# Patient Record
Sex: Male | Born: 1990 | Race: White | Hispanic: No | Marital: Single | Smoking: Never smoker
Health system: Southern US, Community
[De-identification: ages and names within clinical notes are randomized; demographics above are authoritative.]

## PROBLEM LIST (undated history)

## (undated) HISTORY — PX: WISDOM TOOTH EXTRACTION: SHX21

## (undated) HISTORY — PX: TONSILLECTOMY: SUR1361

---
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
## 2011-05-11 ENCOUNTER — Ambulatory Visit: Payer: Self-pay | Admitting: Family Medicine

## 2012-12-20 ENCOUNTER — Ambulatory Visit: Payer: Self-pay | Admitting: Family Medicine

## 2012-12-20 LAB — RAPID STREP-A WITH REFLX: Micro Text Report: NEGATIVE

## 2012-12-23 LAB — BETA STREP CULTURE(ARMC)

## 2013-09-07 ENCOUNTER — Ambulatory Visit: Payer: Self-pay | Admitting: Physician Assistant

## 2013-09-07 LAB — URINALYSIS, COMPLETE
BACTERIA: NEGATIVE
BLOOD: NEGATIVE
Bilirubin,UR: NEGATIVE
GLUCOSE, UR: NEGATIVE mg/dL (ref 0–75)
Ketone: NEGATIVE
LEUKOCYTE ESTERASE: NEGATIVE
Nitrite: NEGATIVE
Ph: 6 (ref 4.5–8.0)
Protein: NEGATIVE
SPECIFIC GRAVITY: 1.025 (ref 1.003–1.030)
WBC UR: NONE SEEN /HPF (ref 0–5)

## 2013-09-07 LAB — GC/CHLAMYDIA PROBE AMP

## 2013-12-08 ENCOUNTER — Ambulatory Visit: Payer: Self-pay

## 2013-12-08 LAB — CBC WITH DIFFERENTIAL/PLATELET
Basophil #: 0 x10 3/mm 3
Basophil %: 0.4 %
Eosinophil #: 0 x10 3/mm 3
Eosinophil %: 0.7 %
HCT: 44.5 %
HGB: 14.8 g/dL
Lymphocyte %: 24.4 %
Lymphs Abs: 1.1 x10 3/mm 3
MCH: 29.7 pg
MCHC: 33.2 g/dL
MCV: 90 fL
Monocyte #: 0.5 "x10 3/mm "
Monocyte %: 10.8 %
Neutrophil #: 2.9 x10 3/mm 3
Neutrophil %: 63.7 %
Platelet: 165 x10 3/mm 3
RBC: 4.97 x10 6/mm 3
RDW: 13.7 %
WBC: 4.5 x10 3/mm 3

## 2013-12-08 LAB — MONONUCLEOSIS SCREEN: Mono Test: POSITIVE

## 2013-12-08 LAB — RAPID STREP-A WITH REFLX: Micro Text Report: NEGATIVE

## 2013-12-08 LAB — RAPID INFLUENZA A&B ANTIGENS

## 2013-12-11 LAB — BETA STREP CULTURE(ARMC)

## 2014-07-14 ENCOUNTER — Encounter: Payer: Self-pay | Admitting: Family Medicine

## 2014-07-14 DIAGNOSIS — L209 Atopic dermatitis, unspecified: Secondary | ICD-10-CM | POA: Insufficient documentation

## 2014-07-14 DIAGNOSIS — M26609 Unspecified temporomandibular joint disorder, unspecified side: Secondary | ICD-10-CM

## 2014-07-14 DIAGNOSIS — B353 Tinea pedis: Secondary | ICD-10-CM | POA: Insufficient documentation

## 2014-07-14 DIAGNOSIS — M26659 Arthropathy of unspecified temporomandibular joint: Secondary | ICD-10-CM | POA: Insufficient documentation

## 2016-09-15 ENCOUNTER — Ambulatory Visit
Admission: EM | Admit: 2016-09-15 | Discharge: 2016-09-15 | Disposition: A | Discharge status: Home / Self Care | Payer: BLUE CROSS/BLUE SHIELD | Attending: Family Medicine | Admitting: Family Medicine

## 2016-09-15 ENCOUNTER — Encounter: Payer: Self-pay | Admitting: *Deleted

## 2016-09-15 DIAGNOSIS — R109 Unspecified abdominal pain: Secondary | ICD-10-CM | POA: Diagnosis not present

## 2016-09-15 DIAGNOSIS — N453 Epididymo-orchitis: Secondary | ICD-10-CM

## 2016-09-15 LAB — CHLAMYDIA/NGC RT PCR (ARMC ONLY)
CHLAMYDIA TR: NOT DETECTED
N gonorrhoeae: NOT DETECTED

## 2016-09-15 MED ORDER — CEFTRIAXONE SODIUM 1 G IJ SOLR
1.0000 g | Freq: Once | INTRAMUSCULAR | Status: AC
  Administered 2016-09-15: 1 g via INTRAMUSCULAR

## 2016-09-15 MED ORDER — LEVOFLOXACIN 500 MG PO TABS: 500.0000 mg | ORAL_TABLET | Freq: Every day | ORAL | 0 refills | Status: DC

## 2016-09-15 NOTE — ED Provider Notes (Signed)
[facility]    CSN: [id number] Arrival date & time: 09/15/16  0850     History   Chief Complaint Chief Complaint  Patient presents with  . Abdominal Pain  . Groin Pain  . Testicle Pain    HPI Tyrone Edwards is a 26 y.o. male.   Patient is 26 year old white male with right testicular pain. He states that he started having some discomfort last week. Pain was somewhat vague on the right side and then it started getting more pronounced by the weekend. He was going to see his doctor on Monday to have this pain reevaluated found that he was longer a patient at the [facility] he was [facility] for years and was taking them in his mid July. Also by Monday the pain has cleared and got better. Because the pain is gotten better he thought that was resolved but Wednesday and Thursday was a friend farm doing some clearing of the leg and not doing any heavy lifting cyanosis and pain in the right testicle again and by this morning pain is increased. He denies any discharge or discomfort. He does report having some type of pain in his right testicle 3 years ago. His own able to really exercise with this pain is similar to the pain he had 3 years ago (have ultrasound at that time which was negative. Initially he could not say what test was done that was at [facility] and when further asked whether there was a ultrasound he is able to confirm that it was an ultrasound states that he was last sexually active in March. No trauma to the testicle or to the ovary area. No previous surgeries operation he has no cyst can past medical history. He does not smoke or chew tobacco tobacco other than tonsillectomy. No previous surgery. He's got strong family history of cancer with father jaw cancer smoker cancers in the family   The history is provided by the patient. No language interpreter was used.  Abdominal Pain  Pain location: Lower abdominal and right testicular pain. Pain quality: aching, shooting,  squeezing and stabbing   Pain radiates to:  Perineum Pain severity:  Moderate Onset quality:  Sudden Duration:  10 days Timing:  Intermittent Progression:  Worsening Chronicity:  New Relieved by:  Nothing Worsened by:  Movement Ineffective treatments:  None tried Associated symptoms: sore throat   Associated symptoms: no anorexia, no belching, no chest pain, no cough, no dysuria, no fatigue, no melena, no nausea and no shortness of breath   Groin Pain  Associated symptoms include abdominal pain. Pertinent negatives include no chest pain and no shortness of breath.  Testicle Pain  Associated symptoms include abdominal pain. Pertinent negatives include no chest pain and no shortness of breath.    History reviewed. No pertinent past medical history.  Patient Active Problem List   Diagnosis Date Noted  . Athlete's foot 07/14/2014  . 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 Atopic dermatitis 07/14/2014    Past Surgical History:  Procedure Laterality Date  . TONSILLECTOMY         Home Medications    Prior to Admission medications   Medication Sig Start Date End Date Taking? Authorizing Provider  levofloxacin (LEVAQUIN) 500 MG tablet Take 1 tablet (500 mg total) by mouth daily. 09/15/16   Hassan Rowan, MD    Family History History reviewed. No pertinent family history.  Social History Social History  Substance Use Topics  . Smoking status: Never Smoker  .  Smokeless tobacco: Never Used  . Alcohol use Yes     Allergies   Patient has no known allergies.   Review of Systems Review of Systems  Constitutional: Negative for fatigue.  HENT: Positive for sore throat.   Respiratory: Negative for cough and shortness of breath.   Cardiovascular: Negative for chest pain.  Gastrointestinal: Positive for abdominal pain. Negative for anorexia, melena and nausea.  Genitourinary: Positive for testicular pain. Negative for dysuria.  All other systems reviewed and  are negative.    Physical Exam Triage Vital Signs ED Triage Vitals  Enc Vitals Group     BP 09/15/16 0946 113/81     Pulse Rate 09/15/16 0946 72     Resp 09/15/16 0946 16     Temp 09/15/16 0946 98.3 F (36.8 C)     Temp Source 09/15/16 0946 Oral     SpO2 09/15/16 0946 100 %     Weight 09/15/16 0948 130 lb (59 kg)     Height 09/15/16 0948 5\' 11"  (1.803 m)     Head Circumference --      Peak Flow --      Pain Score --      Pain Loc --      Pain Edu? --      Excl. in GC? --    No data found.   Updated Vital Signs BP 113/81 (BP Location: Left Arm)   Pulse 72   Temp 98.3 F (36.8 C) (Oral)   Resp 16   Ht 5\' 11"  (1.803 m)   Wt 130 lb (59 kg)   SpO2 100%   BMI 18.13 kg/m   Visual Acuity Right Eye Distance:   Left Eye Distance:   Bilateral Distance:    Right Eye Near:   Left Eye Near:    Bilateral Near:     Physical Exam  Constitutional: He is oriented to person, place, and time. He appears well-developed and well-nourished.  HENT:  Head: Normocephalic and atraumatic.  Eyes: Pupils are equal, round, and reactive to light.  Neck: Normal range of motion.  Pulmonary/Chest: Effort normal.  Abdominal: Hernia confirmed negative in the right inguinal area and confirmed negative in the left inguinal area.  Genitourinary: Penis normal. Right testis shows swelling and tenderness. Circumcised. No penile tenderness.     Genitourinary Comments: Skin has marked tenderness of the right testicle is no signs of left testicle pain to left inguinal pain the right testicle so tender that the pain and tenderness close up into the scrotal sac. No signs of hernia or tenderness over the initial band but when that testicle or epididymis is touch he feels pain going straight up his testicle into his lower abdomen  Musculoskeletal: Normal range of motion.  Lymphadenopathy: No inguinal adenopathy noted on the right or left side.  Neurological: He is alert and oriented to person, place, and  time.  Skin: Skin is warm.  Psychiatric: He has a normal mood and affect.  Vitals reviewed.    UC Treatments / Results  Labs (all labs ordered are listed, but only abnormal results are displayed) Labs Reviewed  CHLAMYDIA/NGC RT PCR [facility] ONLY)    EKG  EKG Interpretation None       Radiology No results found.  Procedures Procedures (including critical care time)  Medications Ordered in UC Medications  cefTRIAXone (ROCEPHIN) injection 1 g (1 g Intramuscular Given 09/15/16 1029)     Initial Impression / Assessment and Plan / UC Course  I have  reviewed the triage vital signs and the nursing notes.  Pertinent labs & imaging results that were available during my care of the patient were reviewed by me and considered in my medical decision making (see chart for details).     Patient appears to have epididymitis and orchitis. He the pain may be different from the pain he had 3 years ago because he has both infected. Last sexual activity states was in March was to get a urine GC and chlamydia but at this time not really concerned about GC and Chlamydia pending (a orchitis epididymitis probably more susceptible [place] had this before. Will going to give him a gram of Rocephin IM placed on Levaquin 500 for 10 days warned about not doing any heavy lifting if GC chlamydia is positive we'll need to add doxycycline to his regimen he is going to be working the formal Wednesday before that time he be doing better. Follow-up with PCP if he continues to have trouble  Final Clinical Impressions(s) / UC Diagnoses   Final diagnoses:  Orchitis and epididymitis    New Prescriptions Discharge Medication List as of 09/15/2016 10:23 AM    START taking these medications   Details  levofloxacin (LEVAQUIN) 500 MG tablet Take 1 tablet (500 mg total) by mouth daily., Starting Fri 09/15/2016, Normal        Note: This dictation was prepared with Dragon dictation along with smaller  phrase technology. Any transcriptional errors that result from this process are unintentional.   Hassan RowanWade, Kaveh Kissinger, MD 09/15/16 [phone]

## 2016-09-15 NOTE — ED Triage Notes (Signed)
Groin pain that radiates to the abd and right testicle. Was involved in heavy activity yesterday. One similar episode 1 week ago that resolved spontaneously. Denies other symptoms.

## 2016-09-15 NOTE — Discharge Instructions (Signed)
Patient bisected any heavy lifting or strenuous activities due to increased risk of tendon interruption on a quinolone

## 2016-10-10 ENCOUNTER — Ambulatory Visit
Admission: EM | Admit: 2016-10-10 | Discharge: 2016-10-10 | Disposition: A | Discharge status: Home / Self Care | Payer: BLUE CROSS/BLUE SHIELD | Attending: Family Medicine | Admitting: Family Medicine

## 2016-10-10 ENCOUNTER — Ambulatory Visit
Admit: 2016-10-10 | Discharge: 2016-10-10 | Disposition: A | Discharge status: Home / Self Care | Payer: BLUE CROSS/BLUE SHIELD | Attending: Family Medicine | Admitting: Family Medicine

## 2016-10-10 ENCOUNTER — Encounter: Payer: Self-pay | Admitting: Emergency Medicine

## 2016-10-10 DIAGNOSIS — N50811 Right testicular pain: Secondary | ICD-10-CM | POA: Insufficient documentation

## 2016-10-10 NOTE — ED Triage Notes (Signed)
Patient states that it has been about 2 weeks since he has finished his antibiotic.  Patient reports that his right testicle pain started back over the weekend.  Patient denies fevers.

## 2016-10-10 NOTE — Discharge Instructions (Signed)
Ultrasound Over the counter tylenol/ibuprofen

## 2016-10-24 ENCOUNTER — Ambulatory Visit
Admission: RE | Admit: 2016-10-24 | Discharge: 2016-10-24 | Disposition: A | Discharge status: Home / Self Care | Payer: BLUE CROSS/BLUE SHIELD | Source: Ambulatory Visit | Attending: Emergency Medicine | Admitting: Emergency Medicine

## 2016-10-24 ENCOUNTER — Other Ambulatory Visit: Payer: Self-pay | Admitting: Emergency Medicine

## 2016-10-24 DIAGNOSIS — K625 Hemorrhage of anus and rectum: Secondary | ICD-10-CM

## 2016-10-24 MED ORDER — IOPAMIDOL (ISOVUE-300) INJECTION 61%
100.0000 mL | Freq: Once | INTRAVENOUS | Status: AC | PRN
  Administered 2016-10-24: 100 mL via INTRAVENOUS

## 2016-10-27 ENCOUNTER — Encounter: Payer: Self-pay | Admitting: Physician Assistant

## 2016-11-09 ENCOUNTER — Encounter (INDEPENDENT_AMBULATORY_CARE_PROVIDER_SITE_OTHER): Payer: Self-pay

## 2016-11-09 ENCOUNTER — Ambulatory Visit (INDEPENDENT_AMBULATORY_CARE_PROVIDER_SITE_OTHER): Payer: BLUE CROSS/BLUE SHIELD | Admitting: Physician Assistant

## 2016-11-09 ENCOUNTER — Encounter: Payer: Self-pay | Admitting: Physician Assistant

## 2016-11-09 VITALS — BP 116/74 | HR 72 | Ht 71.0 in | Wt 131.0 lb

## 2016-11-09 DIAGNOSIS — K648 Other hemorrhoids: Secondary | ICD-10-CM

## 2016-11-09 DIAGNOSIS — K625 Hemorrhage of anus and rectum: Secondary | ICD-10-CM | POA: Diagnosis not present

## 2016-11-09 MED ORDER — HYDROCORTISONE 2.5 % RE CREA: 1.0000 "application " | TOPICAL_CREAM | Freq: Two times a day (BID) | RECTAL | 1 refills | Status: AC

## 2016-11-09 MED ORDER — HYDROCORTISONE 2.5 % RE CREA: 1.0000 "application " | TOPICAL_CREAM | Freq: Two times a day (BID) | RECTAL | 1 refills | Status: DC

## 2016-11-09 NOTE — Patient Instructions (Signed)
We have sent a prescription for Hydrocortisone cream to your pharmacy. You should apply a pea size amount to a glycerin suppository twice a day x 7 days.

## 2016-11-09 NOTE — Progress Notes (Signed)
Chief Complaint: Rectal bleeding and discomfort  HPI:  Tyrone Edwards is a 26 year old Caucasian male with no significant past medical history,  who was referred to me by Deborha Payment, PA-C for a complaint of rectal bleeding .     Patient was seen at the urgent care on 10/24/16 for complaint of rectal bleeding. He had a rectal exam at that time which showed no evidence of external hemorrhoids, though there was frank blood in his stool noted via hemoccult cards. He was prescribed Anusol suppositories. It was also thought that his tenderness was out of proportion to his exam so he was scheduled for a CT of the pelvis with contrast. This was normal with no evidence of perirectal/perianal fluid collection or abscess.   Today, the patient presents to clinic accompanied by his mother and tells me that at the beginning of June he was diagnosed with orchitis and put on antibiotics. His symptoms seemed to go away but about a month later he developed an "awareness" in the same testicle as he was having pain before. He had imaging which was positive for varicose veins.     Then about 2-3 weeks ago the patient developed some bright red blood when he would wipe on the toilet paper. This was "never a huge amount", so he was not worried about it. He then walked one and a half blocks to the grocery store and when he got home had some more rectal discomfort and more blood. He went to the urgent care as above. Patient tells me he never picked up his prescribed suppositories but has been using Preparation H cream and wipes around the outside of his rectum. He feels as though this has been helping his symptoms. His last evidence of blood was last Saturday, approximately 6 days ago when he went for a "[place], another 1.5 miles". Patient tells me he is not typically very active. He does sit a lot but tells me he thinks he has pretty normal stools which are formed. He may have 1-2 a day.     He denies any abdominal pain,  rectal pain or change in bowel habits. No family history of IBD or colorectal cancer.  Past Medical History: Orchitis  Past Surgical History:  Procedure Laterality Date  . TONSILLECTOMY    . WISDOM TOOTH EXTRACTION      Current Outpatient Prescriptions  Medication Sig Dispense Refill  . hydrocortisone (ANUSOL-HC) 2.5 % rectal cream Place 1 application rectally 2 (two) times daily. 30 g 1   No current facility-administered medications for this visit.     Allergies as of 11/09/2016  . (No Known Allergies)    Family History  Problem Relation Age of Onset  . Diabetes Mother   . Throat cancer Father   . COPD Paternal Grandmother     Social History   Social History  . Marital status: Single    Spouse name: N/A  . Number of children: 0  . Years of education: N/A   Occupational History  . student    Social History Main Topics  . Smoking status: Never Smoker  . Smokeless tobacco: Never Used  . Alcohol use Yes  . Drug use: No  . Sexual activity: Not on file   Other Topics Concern  . Not on file   Social History Narrative  . No narrative on file    Review of Systems:     Constitutional: No weight loss, fever, chills, weakness or fatigue Skin: No rash  Cardiovascular: No chest pain Respiratory: No SOB  Gastrointestinal: See HPI and otherwise negative Genitourinary: No dysuria  Neurological: No headache Musculoskeletal: No new muscle or joint pain Hematologic: No bruising Psychiatric: Positive for depression   Physical Exam:  Vital signs: BP 116/74   Pulse 72   Ht 5\' 11"  (1.803 m)   Wt 131 lb (59.4 kg)   BMI 18.27 kg/m   Constitutional:   Pleasant thin appearing Caucasian male appears to be in NAD, Well developed, Well nourished, alert and cooperative Head:  Normocephalic and atraumatic. Eyes:   PEERL, EOMI. No icterus. Conjunctiva pink. Ears:  Normal auditory acuity. Neck:  Supple Throat: Oral cavity and pharynx without inflammation, swelling or  lesion.  Respiratory: Respirations even and unlabored. Lungs clear to auscultation bilaterally.   No wheezes, crackles, or rhonchi.  Cardiovascular: Normal S1, S2. No MRG. Regular rate and rhythm. No peripheral edema, cyanosis or pallor.  Gastrointestinal:  Soft, nondistended, nontender. No rebound or guarding. Normal bowel sounds. No appreciable masses or hepatomegaly. Rectal:  External exam:no hemorrhoids, tenderness, fissure, erythema ; Anoscope: (limited due to patient's clenching) at very tip of scope visualized inflamed internal hemorrhoids Msk:  Symmetrical without gross deformities. Without edema, no deformity or joint abnormality.  Neurologic:  Alert and  oriented x4;  grossly normal neurologically.  Skin:   Dry and intact without significant lesions or rashes. Psychiatric: Demonstrates good judgement and reason without abnormal affect or behaviors.  No recent labs or imaging  Assessment: 1. Rectal bleeding: Intermittent, worse with long walks, likely related to hemorrhoids 2. Internal hemorrhoids: Partially visualized via anoscopy today, inflamed  Plan: 1. Discussed with the patient that I was unable to fully visualize his rectal vault, but could see some inflamed internal hemorrhoids: Recommend we treat these. 2. Prescribed Hydrocortisone ointment and instructed the patient to buy glycerin suppositories to be used twice a day 7 days with one refill. 3. We discussed hemorrhoids and there formation. Answered patient's questions 4. Patient was instructed to increase fiber in his diet to at least 25-35 g per day with use of fiber supplement and/or through his diet. 5. Discussed with the patient that he has increased bleeding or rectal discomfort, recommend he have a colonoscopy for further evaluation 6. Patient to follow in clinic with me or Dr. Lavon PaganiniNandigam in the future as needed.  Hyacinth MeekerJennifer Myrtha Tonkovich, PA-C [facility] 11/09/2016, 10:18 AM  Cc: Deborha PaymentMeyer, Ashley L, PA-C

## 2016-11-10 NOTE — Progress Notes (Signed)
Reviewed and agree with documentation and assessment and plan. K. Veena Kanae Ignatowski , MD   

## 2016-12-31 NOTE — ED Provider Notes (Signed)
[facility]    CSN: [id number] Arrival date & time: 10/10/16  1224     History   Chief Complaint Chief Complaint  Patient presents with  . Testicle Pain    HPI Tyrone Edwards is a 26 y.o. male.   26 yo male with a c/o right testicle pain for about 3 days. Denies any injuries, trauma, fevers, chills, dysuria, penile discharge, redness, swelling. States was treated several weeks ago with a antibiotic for similar and finished that course about 2 weeks ago.    The history is provided by the patient.  Testicle Pain     History reviewed. No pertinent past medical history.  Patient Active Problem List   Diagnosis Date Noted  . Athlete's foot 07/14/2014  . 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 Atopic dermatitis 07/14/2014    Past Surgical History:  Procedure Laterality Date  . TONSILLECTOMY    . WISDOM TOOTH EXTRACTION         Home Medications    Prior to Admission medications   Medication Sig Start Date End Date Taking? Authorizing Provider  hydrocortisone (ANUSOL-HC) 2.5 % rectal cream Place 1 application rectally 2 (two) times daily. 11/09/16   Unk Lightning, PA    Family History Family History  Problem Relation Age of Onset  . Diabetes Mother   . Throat cancer Father   . COPD Paternal Grandmother     Social History Social History  Substance Use Topics  . Smoking status: Never Smoker  . Smokeless tobacco: Never Used  . Alcohol use Yes     Allergies   Patient has no known allergies.   Review of Systems Review of Systems  Genitourinary: Positive for testicular pain.     Physical Exam Triage Vital Signs ED Triage Vitals  Enc Vitals Group     BP 10/10/16 1247 114/80     Pulse Rate 10/10/16 1247 97     Resp 10/10/16 1247 16     Temp 10/10/16 1247 98.8 F (37.1 C)     Temp Source 10/10/16 1247 Oral     SpO2 10/10/16 1247 99 %     Weight 10/10/16 1246 130 lb (59 kg)     Height 10/10/16 1246   (1.803 m)     Head Circumference --      Peak Flow --      Pain Score 10/10/16 1246 1     Pain Loc --      Pain Edu? --      Excl. in GC? --    No data found.   Updated Vital Signs BP 114/80 (BP Location: Left Arm)   Pulse 97   Temp 98.8 F (37.1 C) (Oral)   Resp 16   Ht  (1.803 m)   Wt 130 lb (59 kg)   SpO2 99%   BMI 18.13 kg/m   Visual Acuity Right Eye Distance:   Left Eye Distance:   Bilateral Distance:    Right Eye Near:   Left Eye Near:    Bilateral Near:     Physical Exam  Constitutional: He appears well-developed and well-nourished. No distress.  Genitourinary: Penis normal. Right testis shows tenderness (mild). Right testis shows no mass and no swelling. Right testis is descended. Cremasteric reflex is not absent on the right side. Left testis shows no mass, no swelling and no tenderness. Left testis is descended. Cremasteric reflex is not absent on the left side.  Skin: He is not diaphoretic.  Nursing note and vitals reviewed.    UC Treatments / Results  Labs (all labs ordered are listed, but only abnormal results are displayed) Labs Reviewed - No data to display  EKG  EKG Interpretation None       Radiology No results found.  Procedures Procedures (including critical care time)  Medications Ordered in UC Medications - No data to display   Initial Impression / Assessment and Plan / UC Course  I have reviewed the triage vital signs and the nursing notes.  Pertinent labs & imaging results that were available during my care of the patient were reviewed by me and considered in my medical decision making (see chart for details).       Final Clinical Impressions(s) / UC Diagnoses   Final diagnoses:  Testicular pain, right    New Prescriptions There are no discharge medications for this patient.  1. diagnosis reviewed with patient 2. Recommend scrotal ultrasound today; further management pending [place] results 3. Recommend  supportive treatment with otc analgesics prn 4. Follow-up prn if symptoms worsen or don't improve  Controlled Substance Prescriptions [place] Controlled Substance Registry consulted? Not Applicable   Payton Mccallum, MD 12/31/16 1455

## 2017-11-26 IMAGING — CT CT PELVIS W/ CM
1 series · 16 of 32 positions shown, 20 images · IV contrast (APPLIED)
Comparison: None.

CLINICAL DATA: Rectal bleeding, evaluate for abscess

EXAM:
CT PELVIS WITH CONTRAST
TECHNIQUE: Multidetector CT imaging of the pelvis was performed using the
standard protocol following the bolus administration of intravenous
contrast.
CONTRAST:  100mL PNOKL3-X00 IOPAMIDOL (PNOKL3-X00) INJECTION 61%

[Series 2: routine pelvis w/cm · axial · 0.66mm/px · z∈[-273,-38]mm · 16 of 53 slices shown, 20 images]
[im 4/53  soft-tissue]
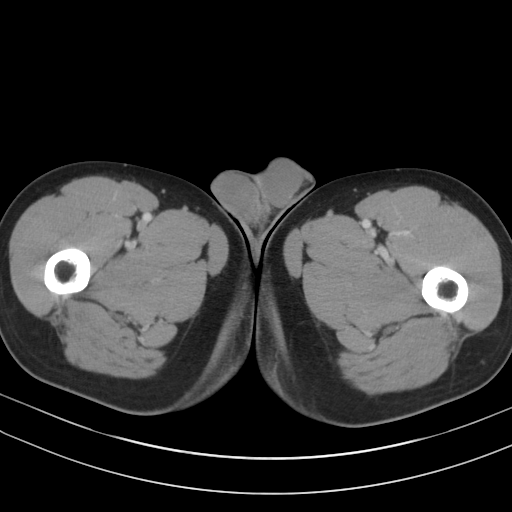
[im 4/53  bone]
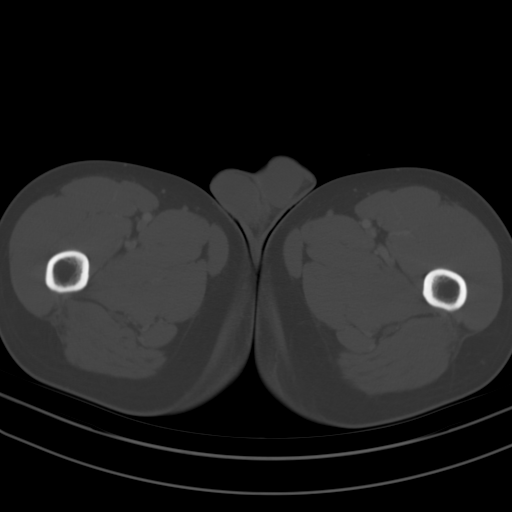
[im 7/53  soft-tissue]
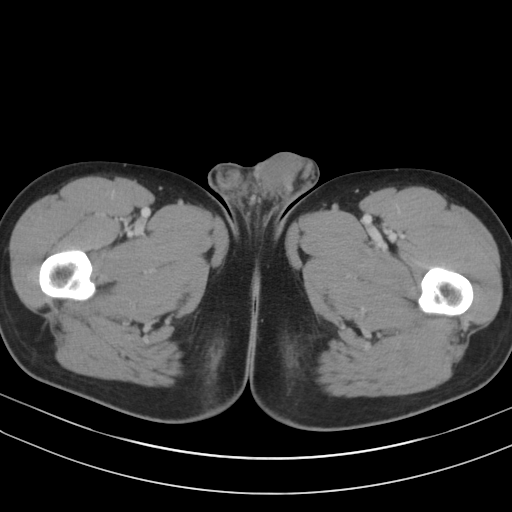
[im 11/53  soft-tissue]
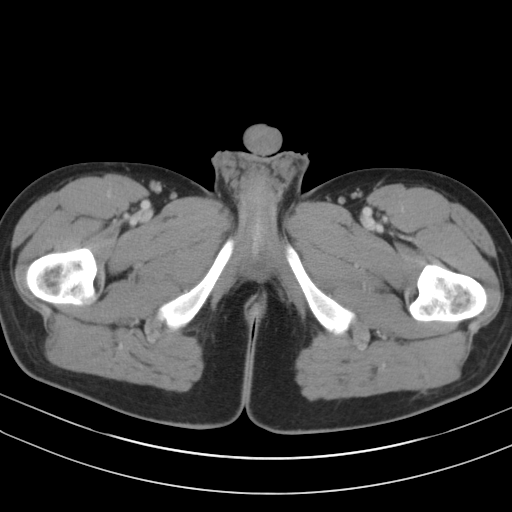
[im 14/53  soft-tissue]
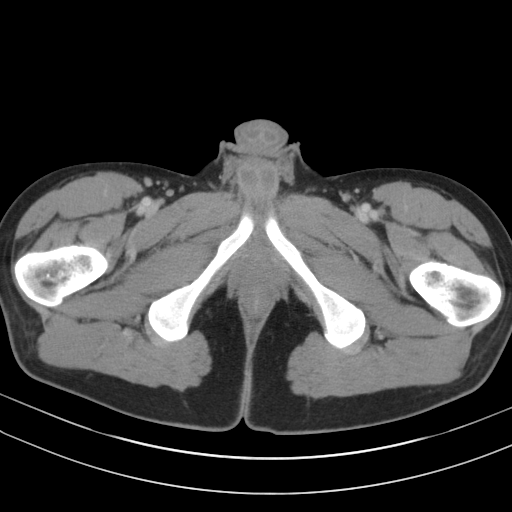
[im 17/53  soft-tissue]
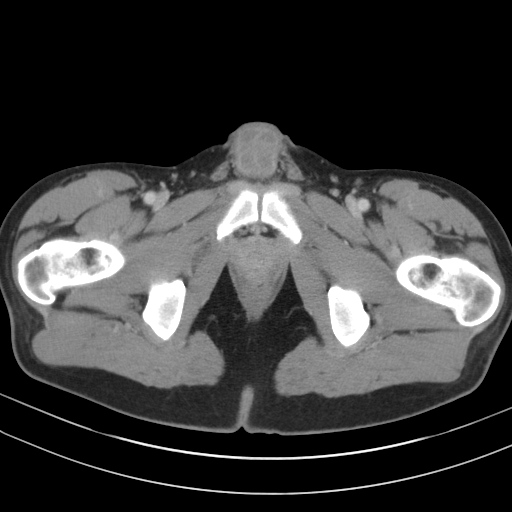
[im 21/53  soft-tissue]
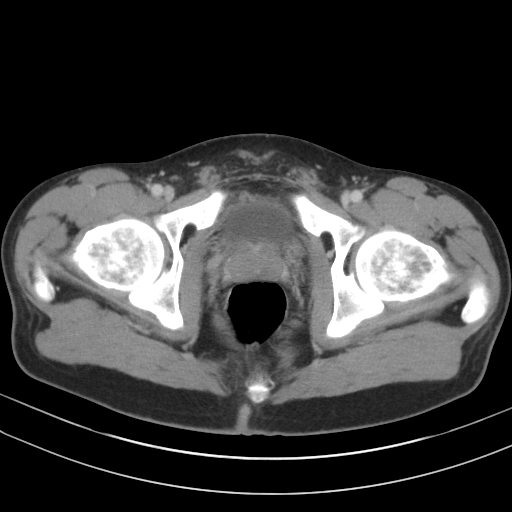
[im 24/53  soft-tissue]
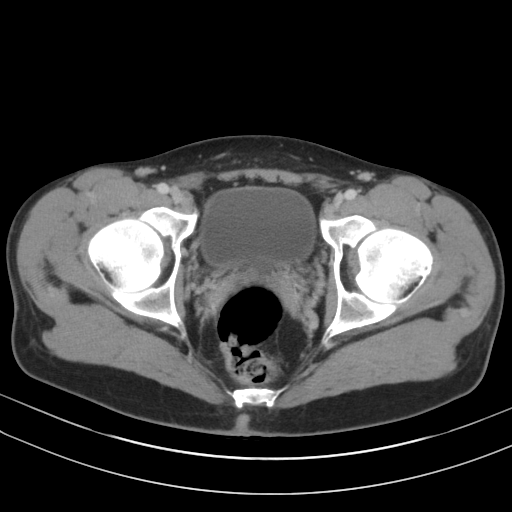
[im 29/53  soft-tissue]
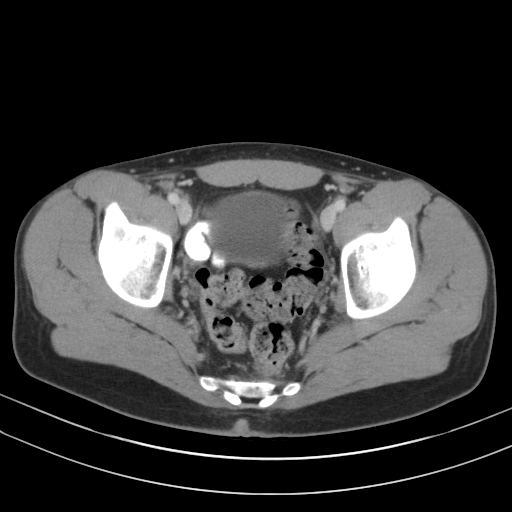
[im 32/53  soft-tissue]
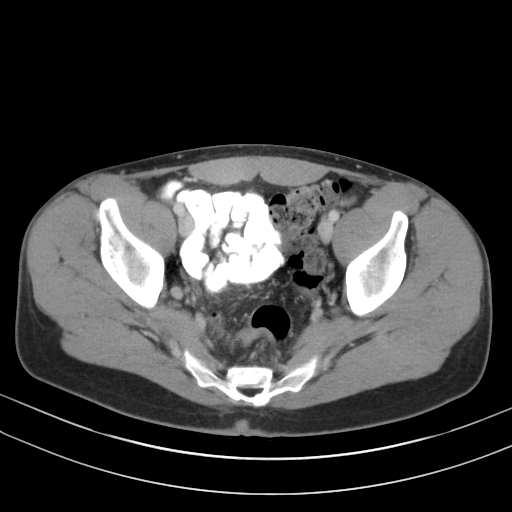
[im 32/53  bone]
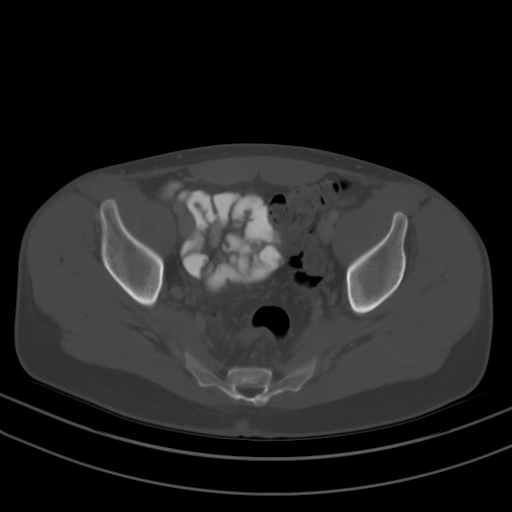
[im 36/53  soft-tissue]
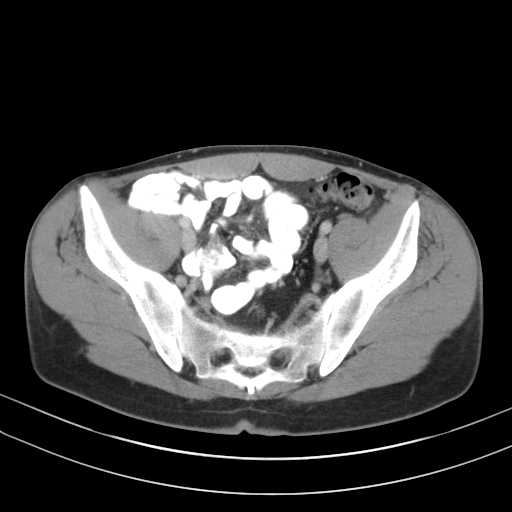
[im 39/53  soft-tissue]
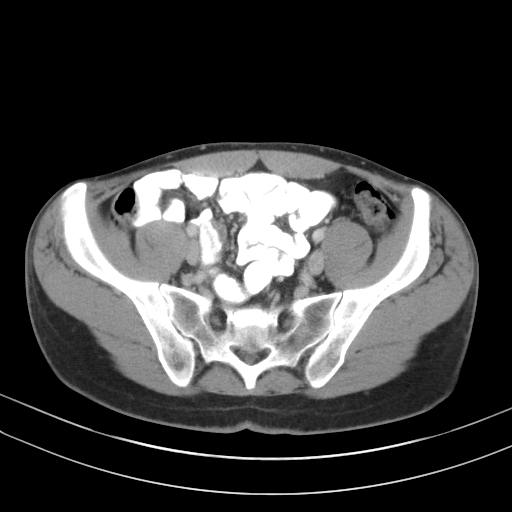
[im 42/53  soft-tissue]
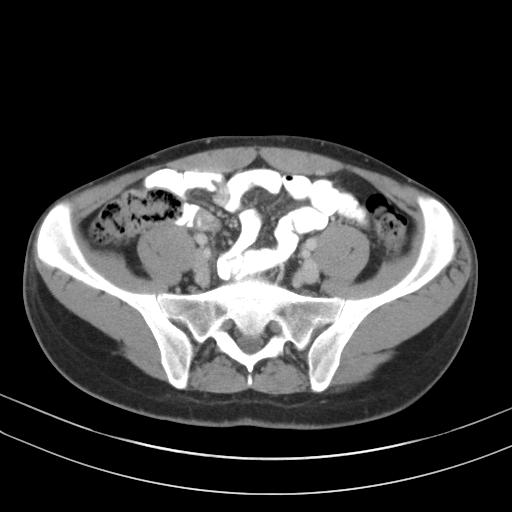
[im 46/53  soft-tissue]
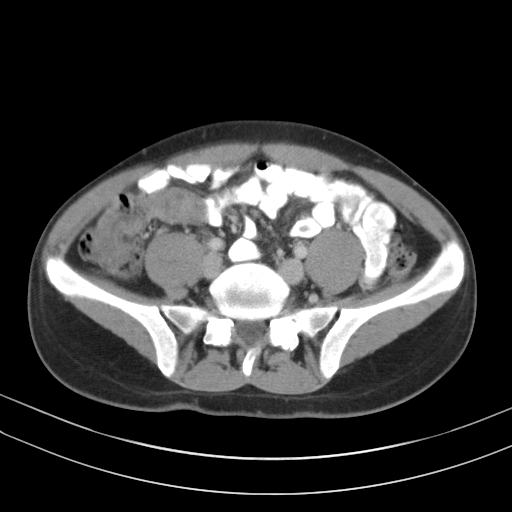
[im 46/53  lung]
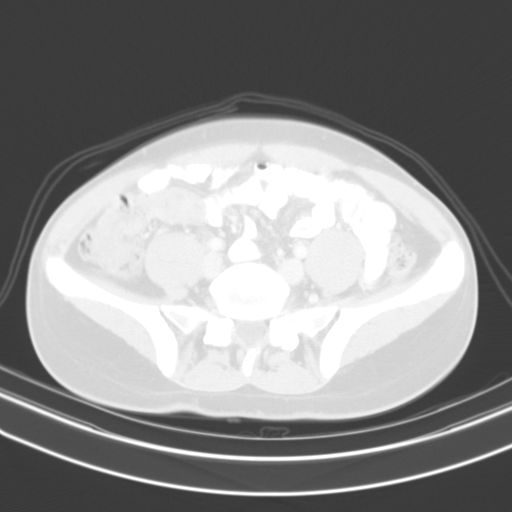
[im 47/53  lung]
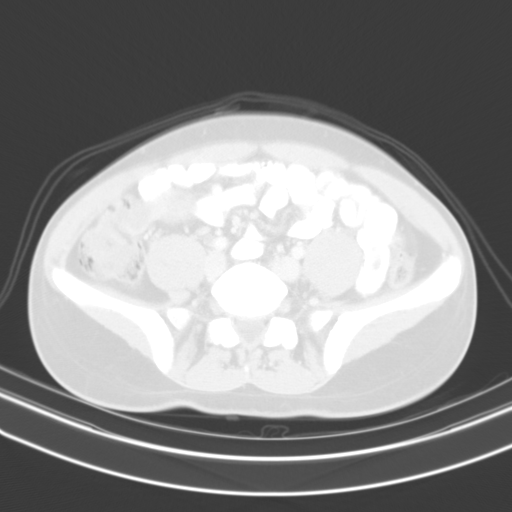
[im 49/53  soft-tissue]
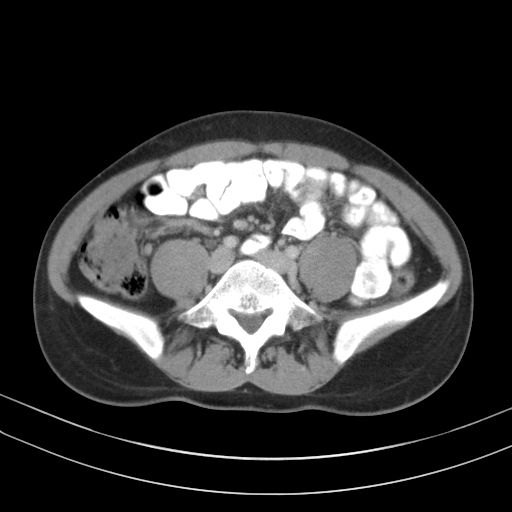
[im 49/53  lung]
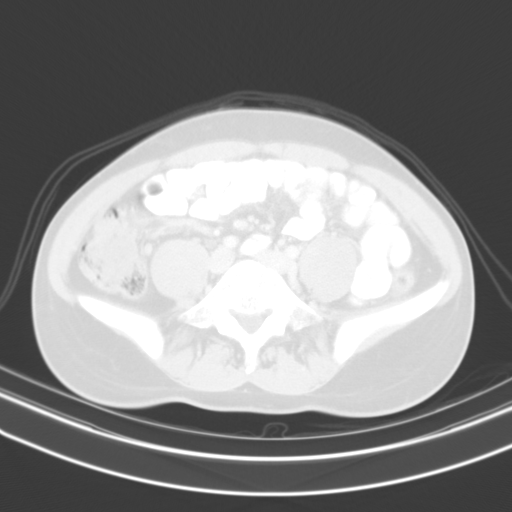
[im 51/53  lung]
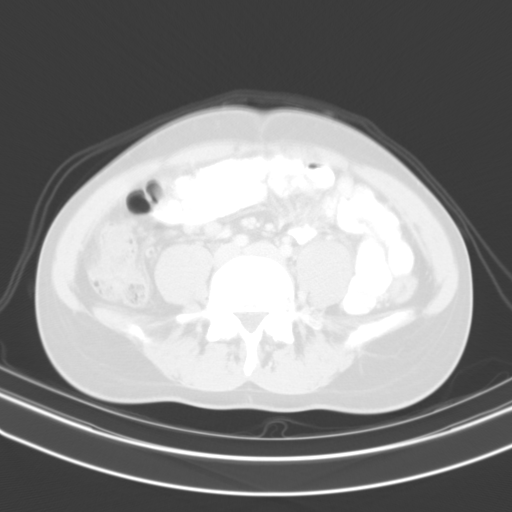

[16 of 32 positions shown; findings below may reference images not displayed]

FINDINGS: Urinary Tract:  Bladder is within normal limits.

Bowel:  Visualized bowel is within normal limits.

No evidence of colonic wall thickening or inflammatory changes.

No evidence of perirectal/perianal fluid collection or abscess.

Vascular/Lymphatic: No evidence of aneurysm.

No suspicious pelvic lymphadenopathy.

Reproductive:  Prostate is unremarkable.

Other:  No pelvic ascites.

Musculoskeletal: No focal osseous lesions.
IMPRESSION: No evidence of perirectal/perianal fluid collection or abscess.

## 2022-04-18 DIAGNOSIS — R69 Illness, unspecified: Secondary | ICD-10-CM | POA: Diagnosis not present

## 2022-04-25 DIAGNOSIS — R69 Illness, unspecified: Secondary | ICD-10-CM | POA: Diagnosis not present

## 2022-05-02 DIAGNOSIS — R69 Illness, unspecified: Secondary | ICD-10-CM | POA: Diagnosis not present
